# Patient Record
Sex: Female | Born: 1980 | Race: Asian | Marital: Married
Health system: Southern US, Community
[De-identification: ages and names within clinical notes are randomized; demographics above are authoritative.]

---

## 2018-01-08 ENCOUNTER — Other Ambulatory Visit: Payer: Self-pay | Admitting: Orthopaedic Surgery

## 2018-01-08 DIAGNOSIS — M542 Cervicalgia: Secondary | ICD-10-CM

## 2018-01-08 DIAGNOSIS — M79602 Pain in left arm: Secondary | ICD-10-CM

## 2018-01-18 ENCOUNTER — Ambulatory Visit
Admission: RE | Admit: 2018-01-18 | Discharge: 2018-01-18 | Disposition: A | Discharge status: Home / Self Care | Payer: Medicaid Other | Source: Ambulatory Visit | Attending: Orthopaedic Surgery | Admitting: Orthopaedic Surgery

## 2018-01-18 DIAGNOSIS — M79602 Pain in left arm: Secondary | ICD-10-CM

## 2018-01-18 DIAGNOSIS — M542 Cervicalgia: Secondary | ICD-10-CM

## 2018-01-18 MED ORDER — TRIAMCINOLONE ACETONIDE 40 MG/ML IJ SUSP (RADIOLOGY)
60.0000 mg | Freq: Once | INTRAMUSCULAR | Status: AC
  Administered 2018-01-18: 60 mg via EPIDURAL

## 2018-01-18 MED ORDER — IOPAMIDOL (ISOVUE-M 300) INJECTION 61%
1.0000 mL | Freq: Once | INTRAMUSCULAR | Status: AC | PRN
  Administered 2018-01-18: 1 mL via EPIDURAL

## 2018-01-18 NOTE — Discharge Instructions (Signed)

## 2018-02-05 ENCOUNTER — Other Ambulatory Visit: Payer: Self-pay | Admitting: Orthopedic Surgery

## 2018-02-05 DIAGNOSIS — M5412 Radiculopathy, cervical region: Secondary | ICD-10-CM

## 2018-02-18 ENCOUNTER — Other Ambulatory Visit: Payer: Medicaid Other

## 2018-02-18 ENCOUNTER — Inpatient Hospital Stay
Admission: RE | Admit: 2018-02-18 | Discharge: 2018-02-18 | Disposition: A | Discharge status: Home / Self Care | Payer: Medicaid Other | Source: Ambulatory Visit | Attending: Orthopedic Surgery | Admitting: Orthopedic Surgery

## 2018-02-28 ENCOUNTER — Ambulatory Visit
Admission: RE | Admit: 2018-02-28 | Discharge: 2018-02-28 | Disposition: A | Discharge status: Home / Self Care | Payer: Medicaid Other | Source: Ambulatory Visit | Attending: Orthopedic Surgery | Admitting: Orthopedic Surgery

## 2018-02-28 DIAGNOSIS — M5412 Radiculopathy, cervical region: Secondary | ICD-10-CM

## 2018-02-28 MED ORDER — TRIAMCINOLONE ACETONIDE 40 MG/ML IJ SUSP (RADIOLOGY)
60.0000 mg | Freq: Once | INTRAMUSCULAR | Status: AC
  Administered 2018-02-28: 60 mg via EPIDURAL

## 2018-02-28 MED ORDER — IOPAMIDOL (ISOVUE-M 300) INJECTION 61%
1.0000 mL | Freq: Once | INTRAMUSCULAR | Status: AC | PRN
  Administered 2018-02-28: 1 mL via EPIDURAL

## 2018-11-18 IMAGING — XA DG INJECT/[PERSON_NAME] INC NEEDLE/CATH/PLC EPI/CERV/THOR W/IMG
2 series · 2 of 2 positions shown · non-contrast
Comparison: none

CLINICAL DATA: Severe left upper extremity pain and weakness.
Prominent leftward disc protrusion at C6-7.

[Series 1: ortho adipose · 1 of 1 slices shown (1 of 2)]
[im 1/1]
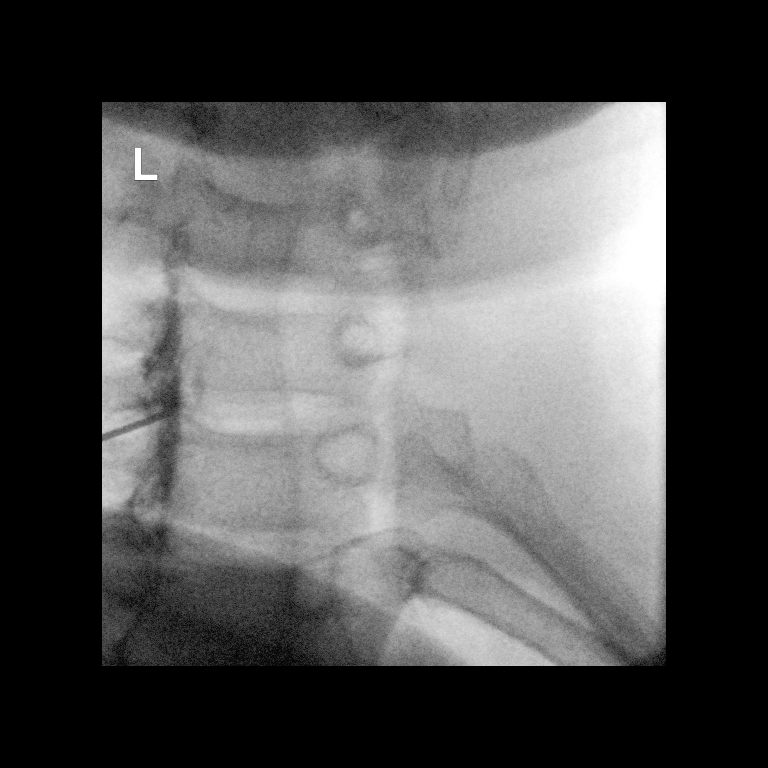

[Series 2: ortho adipose · 1 of 1 slices shown (2 of 2)]
[im 1/1]
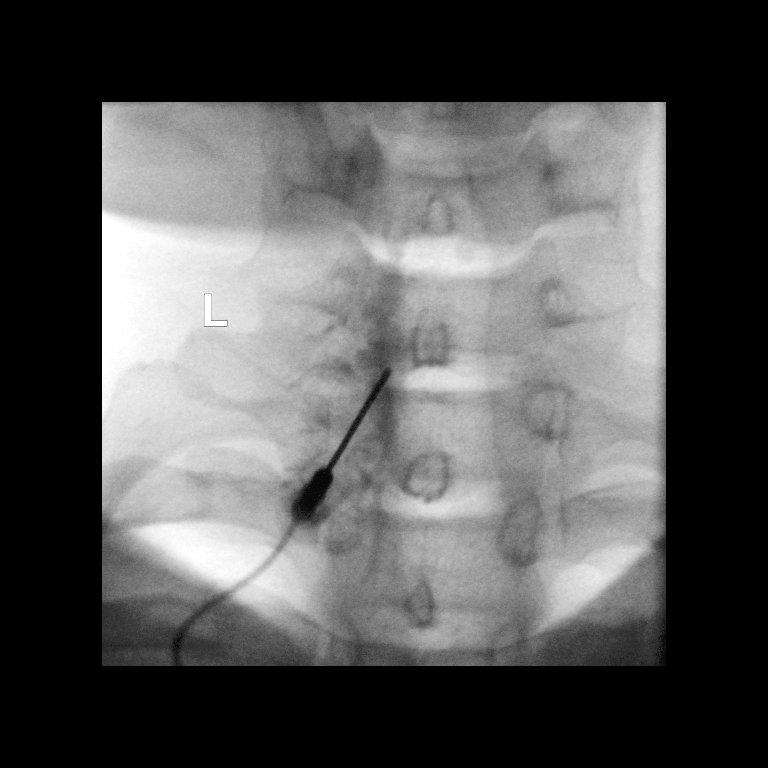

[2 of 2 positions shown; findings below may reference images not displayed]

The patient reports that she is 22 weeks pregnant. We had a thorough
discussion about the risks and benefits of doing this procedure with
very limited potential radiation exposure to the fetus. I answered
all questions. Appropriate consents were signed. The patient's
abdomen was double shielded.

FLUOROSCOPY TIME:  Radiation Exposure Index (as provided by the
fluoroscopic device): 2.12 uGy*m2

Fluoroscopy Time:  3 seconds

Number of Acquired Images:  0

PROCEDURE:
CERVICAL EPIDURAL INJECTION

An interlaminar approach was performed on the left at C7-T1. A 20
gauge epidural needle was advanced using loss-of-resistance
technique.

DIAGNOSTIC EPIDURAL INJECTION

Injection of Isovue-M 300 shows a good epidural pattern with spread
above and below the level of needle placement, primarily on the
left. No vascular opacification is seen. THERAPEUTIC

EPIDURAL INJECTION

1.5 ml of Kenalog 40 mixed with 1 ml of 1% Lidocaine and 2 ml of
normal saline were then instilled. The procedure was well-tolerated,
and the patient was discharged thirty minutes following the
injection in good condition.
IMPRESSION: Technically successful first epidural injection on the left at
C7-T1.

## 2018-12-29 IMAGING — XA DG INJECT/[PERSON_NAME] INC NEEDLE/CATH/PLC EPI/CERV/THOR W/IMG
2 series · 2 of 2 positions shown · non-contrast
Comparison: none

CLINICAL DATA: Recurrent left-sided neck pain and left upper
trimester pregnancy. She did not well with previous cervical
epidural steroid injection. She reports 75% relief of pain for 2-3
weeks after the prior injection. The pain has since recurred. She
reports [DATE] pain. The pain has partially recurred.

[Series 1: ortho adipose · 1 of 1 slices shown (1 of 2)]
[im 1/1]
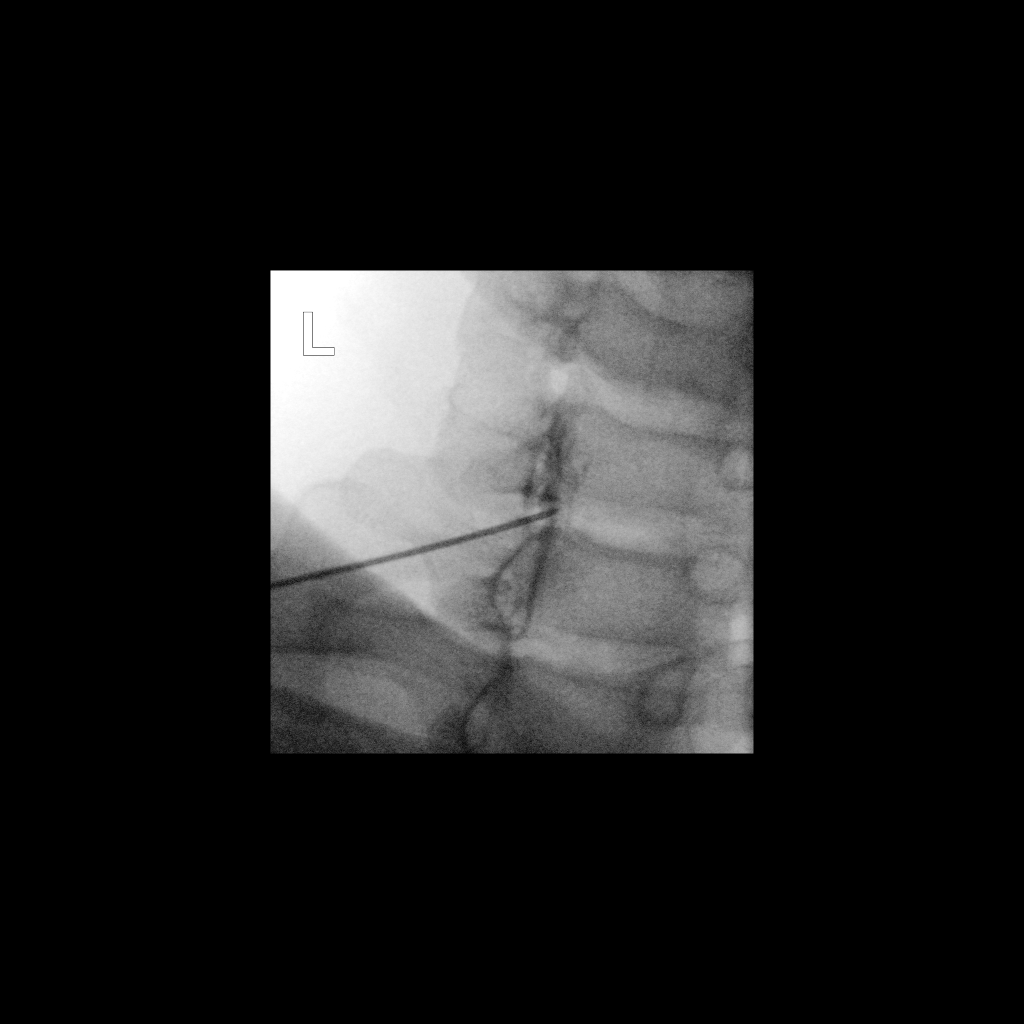

[Series 2: ortho adipose · 1 of 1 slices shown (2 of 2)]
[im 1/1]
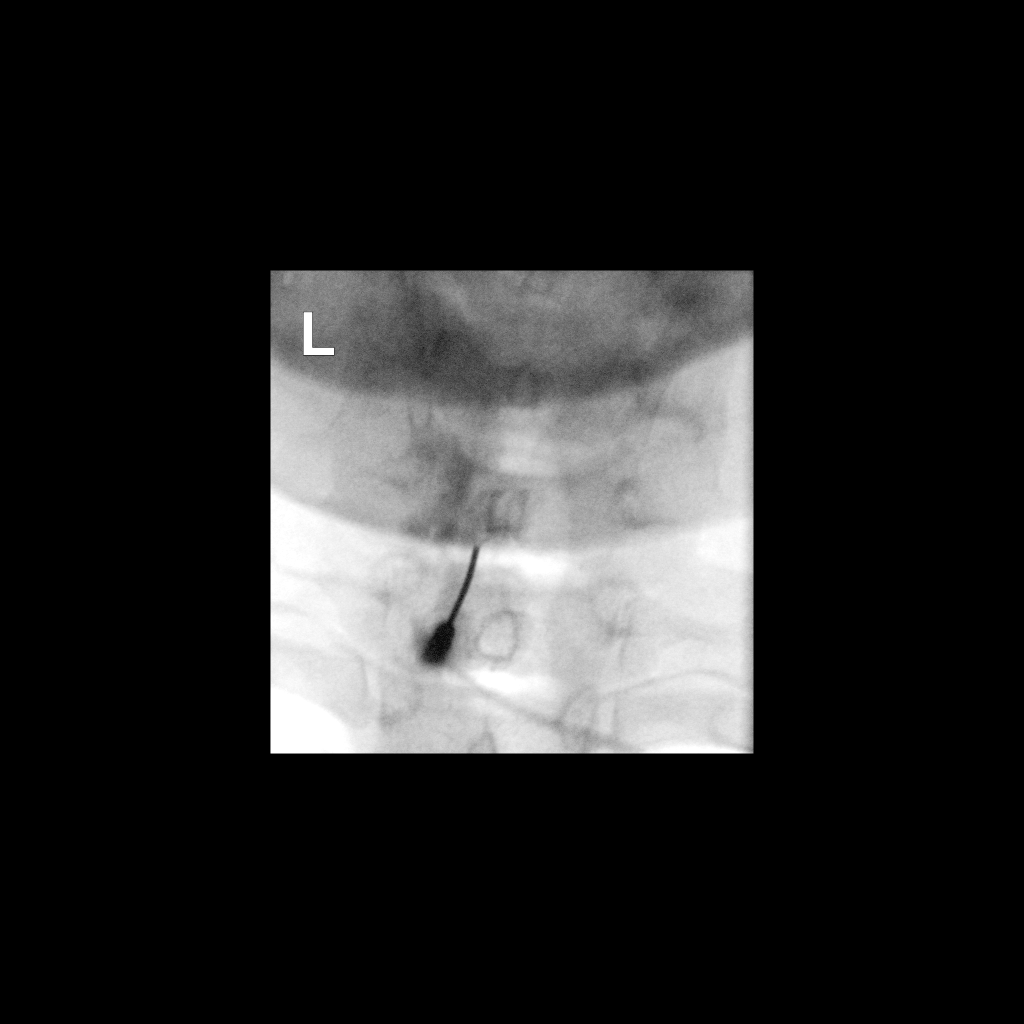

[2 of 2 positions shown; findings below may reference images not displayed]

FLUOROSCOPY TIME:  Radiation Exposure Index (as provided by the
fluoroscopic device): 2.13 uGy*m2

Fluoroscopy Time:  4 seconds

Number of Acquired Images:  0

PROCEDURE:
CERVICAL EPIDURAL INJECTION

An interlaminar approach was performed on the left at C7-T1. A 20
gauge epidural needle was advanced using loss-of-resistance
technique.

DIAGNOSTIC EPIDURAL INJECTION

Injection of Isovue-M 300 shows a good epidural pattern with spread
above and below the level of needle placement, primarily on the
left. No vascular opacification is seen. THERAPEUTIC

EPIDURAL INJECTION

1.5 ml of Kenalog 40 mixed with 1 ml of 1% Lidocaine and 2 ml of
normal saline were then instilled. The procedure was well-tolerated,
and the patient was discharged thirty minutes following the
injection in good condition.
IMPRESSION: Technically successful second epidural injection on the left at
C7-T1.
